# Patient Record
Sex: Female | Born: 2000 | Hispanic: No | Marital: Single
Health system: Southern US, Community
[De-identification: ages and names within clinical notes are randomized; demographics above are authoritative.]

---
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
## 2021-05-25 ENCOUNTER — Ambulatory Visit (HOSPITAL_COMMUNITY)
Admission: EM | Admit: 2021-05-25 | Discharge: 2021-05-25 | Disposition: A | Discharge status: Home / Self Care | Payer: Medicaid Other | Attending: Family | Admitting: Family

## 2021-05-25 ENCOUNTER — Encounter (HOSPITAL_COMMUNITY): Payer: Self-pay | Admitting: Emergency Medicine

## 2021-05-25 ENCOUNTER — Other Ambulatory Visit: Payer: Self-pay

## 2021-05-25 DIAGNOSIS — H1032 Unspecified acute conjunctivitis, left eye: Secondary | ICD-10-CM

## 2021-05-25 DIAGNOSIS — H5789 Other specified disorders of eye and adnexa: Secondary | ICD-10-CM

## 2021-05-25 MED ORDER — GENTAMICIN SULFATE 0.3 % OP SOLN: 2.0000 [drp] | OPHTHALMIC | 0 refills | Status: DC

## 2021-05-25 NOTE — Discharge Instructions (Addendum)
Recommend start Gentamicin eye drops- place 1 to 2 drops in left eye every 4 hours while awake for the next 5 days. Continue to apply warm compresses to left eye as needed. Continue to was hands frequently. Follow-up in 2 to 3 days if not improving or sooner if worsening.

## 2021-05-25 NOTE — ED Triage Notes (Signed)
Pt reports red and swollen left eye x 1 day.

## 2021-05-25 NOTE — ED Provider Notes (Signed)
[place]    CSN: [id number] Arrival date & time: 05/25/21  [id number]      History   Chief Complaint Chief Complaint  Patient presents with   Conjunctivitis    HPI April Dalton is a 21 y.o. female.   21 year old female presents with left eye red, swollen with yellowish discharge that started yesterday. Left eye was matted shut with yellowish crusts this morning. Very irritated and slightly painful. Denies any fever, nasal congestion or sore throat. Slight cough. Right eye with no symptoms. She has placed OTC eye drops to help with redness in her left eye with minimal relief. Does not wear contacts or glasses. Her sister and niece did have pink eye about 2 weeks ago. No other chronic health issues. Takes no daily medication.   The history is provided by the patient.   History reviewed. No pertinent past medical history.  Patient Active Problem List   Diagnosis Date Noted   Acne vulgaris 04/14/2017   Heart murmur 04/14/2017   Obesity with body mass index (BMI) in 95th to 98th percentile for age in pediatric patient 04/14/2017    History reviewed. No pertinent surgical history.  OB History   No obstetric history on file.      Home Medications    Prior to Admission medications   Medication Sig Start Date End Date Taking? Authorizing Provider  gentamicin (GARAMYCIN) 0.3 % ophthalmic solution Place 2 drops into the left eye every 4 (four) hours while awake. 05/25/21  Yes Ahmaud Duthie, Nicholes Stairs, NP    Family History Family History  Problem Relation Age of Onset   Healthy Mother    Healthy Father    Asthma Maternal Grandmother    Hyperlipidemia Maternal Grandmother    Diabetes Paternal Grandmother    Heart disease Maternal Uncle    Heart disease Other     Social History Social History   Tobacco Use   Smoking status: Never   Smokeless tobacco: Never  Substance Use Topics   Alcohol use: No   Drug use: No     Allergies   Celery (apium graveolens var.  dulce) skin test   Review of Systems Review of Systems  Constitutional:  Negative for activity change, appetite change, chills, fatigue and fever.  HENT:  Negative for congestion, ear discharge, ear pain, mouth sores, nosebleeds, postnasal drip, rhinorrhea, sinus pressure, sinus pain, sore throat and trouble swallowing.   Eyes:  Positive for pain, discharge and redness. Negative for photophobia.  Respiratory:  Positive for cough. Negative for shortness of breath and wheezing.   Musculoskeletal:  Negative for arthralgias and myalgias.  Skin:  Negative for color change and rash.  Allergic/Immunologic: Positive for food allergies. Negative for environmental allergies and immunocompromised state.  Neurological:  Negative for dizziness, seizures, syncope, weakness, light-headedness, numbness and headaches.  Hematological:  Negative for adenopathy. Does not bruise/bleed easily.    Physical Exam Triage Vital Signs ED Triage Vitals  Enc Vitals Group     BP 05/25/21 0935 123/77     Pulse Rate 05/25/21 0935 63     Resp 05/25/21 0935 16     Temp 05/25/21 0935 97.8 F (36.6 C)     Temp Source 05/25/21 0935 Oral     SpO2 05/25/21 0935 99 %     Weight 05/25/21 0934 191 lb 12.8 oz (87 kg)     Height 05/25/21 0934 5\' 2"  (1.575 m)     Head Circumference --  Peak Flow --      Pain Score 05/25/21 0934 6     Pain Loc --      Pain Edu? --      Excl. in [place]? --    No data found.  Updated Vital Signs BP 123/77 (BP Location: Right Arm)    Pulse 63    Temp 97.8 F (36.6 C) (Oral)    Resp 16    Ht 5\' 2"  (1.575 m)    Wt 191 lb 12.8 oz (87 kg)    SpO2 99%    BMI 35.08 kg/m   Visual Acuity Right Eye Distance:   Left Eye Distance:   Bilateral Distance:    Right Eye Near:   Left Eye Near:    Bilateral Near:     Physical Exam Vitals and nursing note reviewed.  Constitutional:      General: She is awake. She is not in acute distress.    Appearance: She is well-developed and well-groomed.      Comments: She is sitting comfortably on the exam table in no acute distress.   HENT:     Head: Normocephalic and atraumatic.     Right Ear: Hearing, tympanic membrane, ear canal and external ear normal.     Left Ear: Hearing, tympanic membrane, ear canal and external ear normal.     Nose: Nose normal.     Right Sinus: No maxillary sinus tenderness or frontal sinus tenderness.     Left Sinus: No maxillary sinus tenderness or frontal sinus tenderness.     Mouth/Throat:     Lips: Pink.     Mouth: Mucous membranes are moist.     Pharynx: Oropharynx is clear. Uvula midline. No pharyngeal swelling, oropharyngeal exudate, posterior oropharyngeal erythema or uvula swelling.  Eyes:     General: Vision grossly intact. No visual field deficit.       Right eye: No foreign body, discharge or hordeolum.        Left eye: Discharge present.No foreign body or hordeolum.     Extraocular Movements: Extraocular movements intact.     Conjunctiva/sclera:     Right eye: Right conjunctiva is not injected. No exudate.    Left eye: Left conjunctiva is injected. Exudate present.      Comments: Left lower eyelid swollen with yellowish crusts present on both upper and lower eyelids. Conjunctiva red. Gross vision 20/20 both eyes.   Cardiovascular:     Rate and Rhythm: Normal rate.  Pulmonary:     Effort: Pulmonary effort is normal.  Musculoskeletal:     Cervical back: Normal range of motion and neck supple.  Lymphadenopathy:     Cervical: No cervical adenopathy.  Skin:    General: Skin is warm and dry.     Findings: No rash.  Neurological:     General: No focal deficit present.     Mental Status: She is alert and oriented to person, place, and time.  Psychiatric:        Mood and Affect: Mood normal.        Behavior: Behavior normal. Behavior is cooperative.        Thought Content: Thought content normal.        Judgment: Judgment normal.     UC Treatments / Results  Labs (all labs ordered are  listed, but only abnormal results are displayed) Labs Reviewed - No data to display  EKG   Radiology No results found.  Procedures Procedures (including critical care time)  Medications Ordered in UC Medications - No data to display  Initial Impression / Assessment and Plan / UC Course  I have reviewed the triage vital signs and the nursing notes.  Pertinent labs & imaging results that were available during my care of the patient were reviewed by me and considered in my medical decision making (see chart for details).     Reviewed with patient that she probably has conjunctivitis. Will treat for possible bacterial etiology since no accompanying viral URI symptoms. Recommend start Gentamicin 2 drops in left eye every 4 hours while awake for 5 to 7 days. Continue to wash hands frequently. Discussed contagious nature of pink eye. May apply warm compresses to left eye as needed for comfort. Follow-up in 2 to 3 days if not improving or sooner if worsening.  Final Clinical Impressions(s) / UC Diagnoses   Final diagnoses:  Acute bacterial conjunctivitis of left eye  Redness of eye, left     Discharge Instructions      Recommend start Gentamicin eye drops- place 1 to 2 drops in left eye every 4 hours while awake for the next 5 days. Continue to apply warm compresses to left eye as needed. Continue to was hands frequently. Follow-up in 2 to 3 days if not improving or sooner if worsening.     ED Prescriptions     Medication Sig Dispense Auth. Provider   gentamicin (GARAMYCIN) 0.3 % ophthalmic solution Place 2 drops into the left eye every 4 (four) hours while awake. 5 mL Katy Apo, NP      PDMP not reviewed this encounter.   Katy Apo, NP 05/26/21 1406

## 2021-06-03 ENCOUNTER — Other Ambulatory Visit: Payer: Self-pay

## 2021-06-03 ENCOUNTER — Ambulatory Visit (HOSPITAL_COMMUNITY)
Admission: EM | Admit: 2021-06-03 | Discharge: 2021-06-03 | Disposition: A | Discharge status: Home / Self Care | Payer: Medicaid Other | Attending: Family Medicine | Admitting: Family Medicine

## 2021-06-03 ENCOUNTER — Encounter (HOSPITAL_COMMUNITY): Payer: Self-pay

## 2021-06-03 DIAGNOSIS — T7840XA Allergy, unspecified, initial encounter: Secondary | ICD-10-CM

## 2021-06-03 MED ORDER — TRIAMCINOLONE ACETONIDE 40 MG/ML IJ SUSP
40.0000 mg | Freq: Once | INTRAMUSCULAR | Status: AC
  Administered 2021-06-03: 40 mg via INTRAMUSCULAR

## 2021-06-03 MED ORDER — PREDNISONE 20 MG PO TABS: 40.0000 mg | ORAL_TABLET | Freq: Every day | ORAL | 0 refills | Status: AC

## 2021-06-03 MED ORDER — FAMOTIDINE 20 MG PO TABS: 20.0000 mg | ORAL_TABLET | Freq: Two times a day (BID) | ORAL | 0 refills | Status: AC

## 2021-06-03 MED ORDER — FAMOTIDINE 20 MG PO TABS
ORAL_TABLET | ORAL | Status: AC
  Filled 2021-06-03: qty 1

## 2021-06-03 MED ORDER — TRIAMCINOLONE ACETONIDE 40 MG/ML IJ SUSP
INTRAMUSCULAR | Status: AC
  Filled 2021-06-03: qty 1

## 2021-06-03 MED ORDER — FAMOTIDINE 20 MG PO TABS
20.0000 mg | ORAL_TABLET | Freq: Once | ORAL | Status: AC
  Administered 2021-06-03: 20 mg via ORAL

## 2021-06-03 NOTE — Discharge Instructions (Addendum)
Been given a shot of triamcinolone 40 mg IM here today  You were given 1 dose of famotidine 20 mg today  Take prednisone 20 mg 2 tabs daily for 5 days; begin this medication this afternoon or this evening.  Take famotidine 20 mg twice daily for a few days at least  Take Zyrtec/cetirizine 10 mg 1 daily as an antihistamine.  You can do that instead of Benadryl

## 2021-06-03 NOTE — ED Provider Notes (Signed)
[facility]    CSN: [id number] Arrival date & time: 06/03/21  0806      History   Chief Complaint Chief Complaint  Patient presents with   Oral Swelling    HPI April Dalton is a 21 y.o. female.   HPI Here for upper and lower lip swelling that she first noticed this morning.  Is not really itching around there, but she did have some itching and rash on her legs and arms for couple of days prior to today.  That rash is now gone.  She does not feel like her tongue or throat are swelling.  She had a cough last week but it has resolved in the last couple of days.  No fever or chills.  She drank chocolate milk only last night.  She is not wheezing nor does she feel short of breath.  History reviewed. No pertinent past medical history.  Patient Active Problem List   Diagnosis Date Noted   Acne vulgaris 04/14/2017   Heart murmur 04/14/2017   Obesity with body mass index (BMI) in 95th to 98th percentile for age in pediatric patient 04/14/2017    History reviewed. No pertinent surgical history.  OB History   No obstetric history on file.      Home Medications    Prior to Admission medications   Medication Sig Start Date End Date Taking? Authorizing Provider  famotidine (PEPCID) 20 MG tablet Take 1 tablet (20 mg total) by mouth 2 (two) times daily. 06/03/21  Yes Barrett Henle, MD  predniSONE (DELTASONE) 20 MG tablet Take 2 tablets (40 mg total) by mouth daily with breakfast for 5 days. 06/03/21 06/08/21 Yes Reginald Weida, Gwenlyn Perking, MD    Family History Family History  Problem Relation Age of Onset   Healthy Mother    Healthy Father    Asthma Maternal Grandmother    Hyperlipidemia Maternal Grandmother    Diabetes Paternal Grandmother    Heart disease Maternal Uncle    Heart disease Other     Social History Social History   Tobacco Use   Smoking status: Never   Smokeless tobacco: Never  Substance Use Topics   Alcohol use: No   Drug use: No      Allergies   Celery (apium graveolens var. dulce) skin test   Review of Systems Review of Systems   Physical Exam Triage Vital Signs ED Triage Vitals  Enc Vitals Group     BP 06/03/21 0823 (!) 112/55     Pulse Rate 06/03/21 0823 61     Resp 06/03/21 0823 18     Temp 06/03/21 0823 98 F (36.7 C)     Temp Source 06/03/21 0823 Oral     SpO2 06/03/21 0823 100 %     Weight --      Height --      Head Circumference --      Peak Flow --      Pain Score 06/03/21 0824 2     Pain Loc --      Pain Edu? --      Excl. in [place]? --    No data found.  Updated Vital Signs BP (!) 112/55 (BP Location: Left Arm)    Pulse 61    Temp 98 F (36.7 C) (Oral)    Resp 18    LMP 06/01/2021    SpO2 100%   Visual Acuity Right Eye Distance:   Left Eye Distance:   Bilateral Distance:  Right Eye Near:   Left Eye Near:    Bilateral Near:     Physical Exam Vitals reviewed.  Constitutional:      General: She is not in acute distress.    Appearance: She is not toxic-appearing.  HENT:     Right Ear: Tympanic membrane normal.     Left Ear: Tympanic membrane normal.     Nose: Nose normal.     Mouth/Throat:     Mouth: Mucous membranes are moist.     Pharynx: No oropharyngeal exudate or posterior oropharyngeal erythema.     Comments: Upper and lower lips are mildly swollen Eyes:     Extraocular Movements: Extraocular movements intact.     Conjunctiva/sclera: Conjunctivae normal.     Pupils: Pupils are equal, round, and reactive to light.  Cardiovascular:     Rate and Rhythm: Normal rate and regular rhythm.     Heart sounds: No murmur heard. Pulmonary:     Effort: Pulmonary effort is normal.     Breath sounds: No stridor. No wheezing, rhonchi or rales.  Musculoskeletal:     Cervical back: Neck supple.  Lymphadenopathy:     Cervical: No cervical adenopathy.  Skin:    Capillary Refill: Capillary refill takes less than 2 seconds.     Coloration: Skin is not jaundiced or pale.   Neurological:     General: No focal deficit present.     Mental Status: She is oriented to person, place, and time.     UC Treatments / Results  Labs (all labs ordered are listed, but only abnormal results are displayed) Labs Reviewed - No data to display  EKG   Radiology No results found.  Procedures Procedures (including critical care time)  Medications Ordered in UC Medications  triamcinolone acetonide (KENALOG-40) injection 40 mg (has no administration in time range)  famotidine (PEPCID) tablet 20 mg (has no administration in time range)    Initial Impression / Assessment and Plan / UC Course  I have reviewed the triage vital signs and the nursing notes.  Pertinent labs & imaging results that were available during my care of the patient were reviewed by me and considered in my medical decision making (see chart for details).     We will treat for allergic reaction with triamcinolone and famotidine here, and prescription for prednisone and famotidine at home. Final Clinical Impressions(s) / UC Diagnoses   Final diagnoses:  Allergic reaction, initial encounter     Discharge Instructions      Been given a shot of triamcinolone 40 mg IM here today  You were given 1 dose of famotidine 20 mg today  Take prednisone 20 mg 2 tabs daily for 5 days; begin this medication this afternoon or this evening.  Take famotidine 20 mg twice daily for a few days at least  Take Zyrtec/cetirizine 10 mg 1 daily as an antihistamine.  You can do that instead of Benadryl     ED Prescriptions     Medication Sig Dispense Auth. Provider   predniSONE (DELTASONE) 20 MG tablet Take 2 tablets (40 mg total) by mouth daily with breakfast for 5 days. 10 tablet Barrett Henle, MD   famotidine (PEPCID) 20 MG tablet Take 1 tablet (20 mg total) by mouth 2 (two) times daily. 30 tablet Shammond Arave, Gwenlyn Perking, MD      PDMP not reviewed this encounter.   Barrett Henle, MD 06/03/21  [phone]

## 2021-06-03 NOTE — ED Triage Notes (Signed)
Pt presents with lip swelling that is slightly painful since waking up this morning.
